# Patient Record
Sex: Female | Born: 2000 | Hispanic: Yes | Marital: Single | Smoking: Never smoker
Health system: Southern US, Community
[De-identification: ages and names within clinical notes are randomized; demographics above are authoritative.]

---

## 2019-12-11 ENCOUNTER — Encounter: Payer: Self-pay | Admitting: Emergency Medicine

## 2019-12-11 ENCOUNTER — Other Ambulatory Visit: Payer: Self-pay

## 2019-12-11 ENCOUNTER — Emergency Department
Admission: EM | Admit: 2019-12-11 | Discharge: 2019-12-11 | Disposition: A | Discharge status: Home / Self Care | Payer: No Typology Code available for payment source | Attending: Emergency Medicine | Admitting: Emergency Medicine

## 2019-12-11 ENCOUNTER — Emergency Department: Payer: No Typology Code available for payment source

## 2019-12-11 DIAGNOSIS — Y999 Unspecified external cause status: Secondary | ICD-10-CM | POA: Diagnosis not present

## 2019-12-11 DIAGNOSIS — Y939 Activity, unspecified: Secondary | ICD-10-CM | POA: Insufficient documentation

## 2019-12-11 DIAGNOSIS — S59912A Unspecified injury of left forearm, initial encounter: Secondary | ICD-10-CM | POA: Diagnosis present

## 2019-12-11 DIAGNOSIS — S40022A Contusion of left upper arm, initial encounter: Secondary | ICD-10-CM | POA: Diagnosis not present

## 2019-12-11 DIAGNOSIS — Y929 Unspecified place or not applicable: Secondary | ICD-10-CM | POA: Insufficient documentation

## 2019-12-11 MED ORDER — IBUPROFEN 600 MG PO TABS
600.0000 mg | ORAL_TABLET | Freq: Once | ORAL | Status: AC
  Administered 2019-12-11: 600 mg via ORAL
  Filled 2019-12-11: qty 1
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
## 2019-12-11 NOTE — Discharge Instructions (Addendum)
You may take Tylenol and/or Ibuprofen as needed for pain.  Wear sling as needed for comfort.  Return to the ER for worsening symptoms, persistent vomiting, difficulty breathing or other concerns.

## 2020-12-01 ENCOUNTER — Encounter (HOSPITAL_BASED_OUTPATIENT_CLINIC_OR_DEPARTMENT_OTHER): Payer: Self-pay

## 2020-12-01 ENCOUNTER — Other Ambulatory Visit: Payer: Self-pay

## 2020-12-01 ENCOUNTER — Emergency Department (HOSPITAL_BASED_OUTPATIENT_CLINIC_OR_DEPARTMENT_OTHER)
Admission: EM | Admit: 2020-12-01 | Discharge: 2020-12-02 | Disposition: A | Discharge status: Home / Self Care | Payer: Medicaid Other | Attending: Emergency Medicine | Admitting: Emergency Medicine

## 2020-12-01 ENCOUNTER — Emergency Department (HOSPITAL_BASED_OUTPATIENT_CLINIC_OR_DEPARTMENT_OTHER): Payer: Medicaid Other

## 2020-12-01 DIAGNOSIS — M25511 Pain in right shoulder: Secondary | ICD-10-CM | POA: Diagnosis not present

## 2020-12-01 DIAGNOSIS — S99911A Unspecified injury of right ankle, initial encounter: Secondary | ICD-10-CM | POA: Diagnosis not present

## 2020-12-01 DIAGNOSIS — W19XXXA Unspecified fall, initial encounter: Secondary | ICD-10-CM

## 2020-12-01 DIAGNOSIS — M546 Pain in thoracic spine: Secondary | ICD-10-CM | POA: Insufficient documentation

## 2020-12-01 DIAGNOSIS — W108XXA Fall (on) (from) other stairs and steps, initial encounter: Secondary | ICD-10-CM | POA: Insufficient documentation

## 2020-12-01 MED ORDER — METHOCARBAMOL 500 MG PO TABS: 500.0000 mg | ORAL_TABLET | Freq: Two times a day (BID) | ORAL | 0 refills | Status: AC

## 2020-12-01 MED ORDER — HYDROCODONE-ACETAMINOPHEN 5-325 MG PO TABS: 1.0000 | ORAL_TABLET | Freq: Four times a day (QID) | ORAL | 0 refills | Status: AC | PRN

## 2020-12-01 MED ORDER — LIDOCAINE 5 % EX PTCH: 1.0000 | MEDICATED_PATCH | CUTANEOUS | 0 refills | Status: AC

## 2020-12-01 MED ORDER — IBUPROFEN 600 MG PO TABS: 600.0000 mg | ORAL_TABLET | Freq: Four times a day (QID) | ORAL | 0 refills | Status: AC | PRN

## 2020-12-01 NOTE — ED Triage Notes (Addendum)
Pt states she slipped/fell down steps today-pain to right shoulder and right ankle-to triage in w/c-NAD

## 2020-12-01 NOTE — ED Provider Notes (Signed)
[facility] EMERGENCY DEPARTMENT Provider Note   CSN: [id number] Arrival date & time: 12/01/20  2204     History Chief Complaint  Patient presents with  . Fall    Maria Nichols is a 19 y.o. female.  HPI     Maria Nichols is a 19 y.o. female, patient with no pertinent past medical history, presenting to the ED with injuries following a fall that occurred shortly prior to arrival. Patient states she slipped and fell down around 10 steps. She is complaining of pain to the right posterior shoulder as well as the right ankle.  Denies LOC, nausea/vomiting, neck pain, other back pain, numbness, weakness, chest pain, abdominal pain, confusion, other joint pain, shortness of breath, or any other complaints.  History reviewed. No pertinent past medical history.  There are no problems to display for this patient.   History reviewed. No pertinent surgical history.   OB History   No obstetric history on file.     No family history on file.  Social History   Tobacco Use  . Smoking status: Never Smoker  . Smokeless tobacco: Never Used  Vaping Use  . Vaping Use: Never used  Substance Use Topics  . Alcohol use: Yes    Comment: occ  . Drug use: Never    Home Medications Prior to Admission medications   Medication Sig Start Date End Date Taking? Authorizing Provider  HYDROcodone-acetaminophen (NORCO/VICODIN) 5-325 MG tablet Take 1 tablet by mouth every 6 (six) hours as needed for severe pain. 12/01/20   Shawonda Kerce C, PA-C  ibuprofen (ADVIL) 600 MG tablet Take 1 tablet (600 mg total) by mouth every 6 (six) hours as needed. 12/01/20   Rollie Hynek C, PA-C  lidocaine (LIDODERM) 5 % Place 1 patch onto the skin daily. Remove & Discard patch within 12 hours or as directed by MD 12/01/20   Tearsa Kowalewski C, PA-C  methocarbamol (ROBAXIN) 500 MG tablet Take 1 tablet (500 mg total) by mouth 2 (two) times daily. 12/01/20   Deysha Cartier, Hillard Danker, PA-C    Allergies    Patient  has no known allergies.  Review of Systems   Review of Systems  Constitutional: Negative for diaphoresis.  Respiratory: Negative for shortness of breath.   Cardiovascular: Negative for chest pain.  Gastrointestinal: Negative for abdominal pain, nausea and vomiting.  Musculoskeletal: Positive for arthralgias and back pain. Negative for neck pain.  Neurological: Negative for dizziness, syncope, weakness, numbness and headaches.  All other systems reviewed and are negative.   Physical Exam Updated Vital Signs BP 119/85 (BP Location: Left Arm)   Pulse 93   Temp 97.8 F (36.6 C) (Oral)   Resp 18   Ht 5\' 2"  (1.575 m)   Wt 61.2 kg   SpO2 100%   BMI 24.69 kg/m   Physical Exam Vitals and nursing note reviewed.  Constitutional:      General: She is not in acute distress.    Appearance: She is well-developed. She is not diaphoretic.  HENT:     Head: Normocephalic.     Comments: No evidence of wounds or trauma to the head or face.    Mouth/Throat:     Mouth: Mucous membranes are moist.     Pharynx: Oropharynx is clear.  Eyes:     Conjunctiva/sclera: Conjunctivae normal.  Cardiovascular:     Rate and Rhythm: Normal rate and regular rhythm.     Pulses: Normal pulses.  Radial pulses are 2+ on the right side and 2+ on the left side.       Dorsalis pedis pulses are 2+ on the right side and 2+ on the left side.       Posterior tibial pulses are 2+ on the right side and 2+ on the left side.     Heart sounds: Normal heart sounds.  Pulmonary:     Effort: Pulmonary effort is normal. No respiratory distress.     Breath sounds: Normal breath sounds.  Abdominal:     Palpations: Abdomen is soft.     Tenderness: There is no abdominal tenderness. There is no guarding.  Musculoskeletal:       Arms:     Cervical back: Normal range of motion and neck supple.     Comments: Tenderness to the right anterior and lateral ankle with no noted deformity or instability.  No tenderness, pain,  or other signs of injury into the rest of the right lower extremity or into the foot.  Tenderness to the right upper back over the right scapula with very tiny areas of erythema noted but no swelling, deformity, or instability.  Range of motion in the right shoulder largely intact, including ability to raise her arm over her head.  No winging of the scapula noted. No tenderness or pain over the clavicles. She has tenderness to the right posterior ribs without crepitus or noted deformity.  Normal motor function intact in all extremities. No midline spinal tenderness.   Skin:    General: Skin is warm and dry.  Neurological:     Mental Status: She is alert.     Comments: No noted acute cognitive deficit. Sensation grossly intact to light touch in the extremities.   Grip strengths equal bilaterally.   Strength 5/5 in all extremities.  No gait disturbance.  Coordination intact.  Cranial nerves III-XII grossly intact.  Handles oral secretions without noted difficulty.  No noted phonation or speech deficit. No facial droop.   Psychiatric:        Mood and Affect: Mood and affect normal.        Speech: Speech normal.        Behavior: Behavior normal.     ED Results / Procedures / Treatments   Labs (all labs ordered are listed, but only abnormal results are displayed) Labs Reviewed - No data to display  EKG None  Radiology DG Shoulder Right  Result Date: 12/01/2020 CLINICAL DATA:  Slip and fall down stairs today with right shoulder injury. EXAM: RIGHT SHOULDER - 2+ VIEW COMPARISON:  None. FINDINGS: There is no evidence of fracture or dislocation. There is no evidence of arthropathy or other focal bone abnormality. Soft tissues are unremarkable. Included right hemithorax is intact. IMPRESSION: Negative radiographs of the right shoulder. Electronically Signed   By: Narda Rutherford M.D.   On: 12/01/2020 22:45   DG Ankle Complete Right  Result Date: 12/01/2020 CLINICAL DATA:  Injury  EXAM: RIGHT ANKLE - COMPLETE 3+ VIEW COMPARISON:  None. FINDINGS: There is no evidence of fracture, dislocation, or joint effusion. There is no evidence of arthropathy or other focal bone abnormality. Soft tissues are unremarkable. IMPRESSION: Negative. Electronically Signed   By: Jasmine Pang M.D.   On: 12/01/2020 22:44    Procedures Procedures (including critical care time)  Medications Ordered in ED Medications - No data to display  ED Course  I have reviewed the triage vital signs and the nursing notes.  Pertinent labs &  imaging results that were available during my care of the patient were reviewed by me and considered in my medical decision making (see chart for details).    MDM Rules/Calculators/A&P                          Patient presents for evaluation following a fall. No focal neurologic deficits noted. I personally reviewed and interpreted the patient's x-rays.  No acute osseous abnormalities noted. Based on the patient's areas of tenderness found on my exam, I recommended additional x-rays, including scapula and ribs, with explanation given to the patient for my reasoning, however, patient declined. The patient was given instructions for home care as well as return precautions. Patient voices understanding of these instructions, accepts the plan, and is comfortable with discharge.     Final Clinical Impression(s) / ED Diagnoses Final diagnoses:  Fall, initial encounter  Acute pain of right shoulder  Injury of right ankle, initial encounter    Rx / DC Orders ED Discharge Orders         Ordered    ibuprofen (ADVIL) 600 MG tablet  Every 6 hours PRN        12/01/20 2336    methocarbamol (ROBAXIN) 500 MG tablet  2 times daily        12/01/20 2336    lidocaine (LIDODERM) 5 %  Every 24 hours        12/01/20 2336    HYDROcodone-acetaminophen (NORCO/VICODIN) 5-325 MG tablet  Every 6 hours PRN        12/01/20 2336           Anselm Pancoast, PA-C 12/02/20 0025     Molpus, John, MD 12/02/20 [phone]

## 2020-12-01 NOTE — Discharge Instructions (Addendum)
You have been seen today for injuries to the ankle and shoulder. There were no acute abnormalities on the x-rays, including no sign of fracture or dislocation, however, there could be injuries to the soft tissues, such as the ligaments or tendons that are not seen on xrays. There could also be what are called occult fractures that are small fractures not seen on xray. Antiinflammatory medications: Take 600 mg of ibuprofen every 6 hours or 440 mg (over the counter dose) to 500 mg (prescription dose) of naproxen every 12 hours for the next 3 days. After this time, these medications may be used as needed for pain. Take these medications with food to avoid upset stomach. Choose only one of these medications, do not take them together. Acetaminophen (generic for Tylenol): Should you continue to have additional pain while taking the ibuprofen or naproxen, you may add in acetaminophen as needed. Your daily total maximum amount of acetaminophen from all sources should be limited to 4000mg /day for persons without liver problems, or 2000mg /day for those with liver problems. Vicodin: May take Vicodin (hydrocodone-acetaminophen) as needed for severe pain.   Do not drive or perform other dangerous activities while taking this medication as it can cause drowsiness as well as changes in reaction time and judgement.   Please note that each pill of Vicodin contains 325 mg of acetaminophen (generic for Tylenol) and the above dosage limits apply.  Methocarbamol: Methocarbamol (generic for Robaxin) is a muscle relaxer and can help relieve stiff muscles or muscle spasms.  Do not drive or perform other dangerous activities while taking this medication as it can cause drowsiness as well as changes in reaction time and judgement. Lidocaine patches: These are available via either prescription or over-the-counter. The over-the-counter option may be more economical one and are likely just as effective. There are multiple  over-the-counter brands, such as Salonpas. Ice: May apply ice to the area over the next 24 hours for 15 minutes at a time to reduce swelling. Elevation: Keep the extremity elevated as often as possible to reduce pain and inflammation. Support: Wear the cam boot for support and comfort. Wear this until pain resolves. You will be weight-bearing as tolerated, which means you can slowly start to put weight on the extremity and increase amount and frequency as pain allows. Follow up: If symptoms are improving, you may follow up with your primary care provider for any continued management. If symptoms are not starting to improve within a week, you should follow up with the orthopedic specialist within two weeks. Return: Return to the ED for numbness, weakness, increasing pain, overall worsening symptoms, loss of function, or if symptoms are not improving, you have tried to follow up with the orthopedic specialist, and have been unable to do so.  For prescription assistance, may try using prescription discount sites or apps, such as goodrx.com or Good Rx smart phone app.

## 2020-12-21 ENCOUNTER — Telehealth: Payer: Self-pay | Admitting: Family Medicine

## 2020-12-21 NOTE — Telephone Encounter (Signed)
Called pt to schedule ED follow up appt w/provider-- no answer, unable to lv msg, phone just rings.  --call back later.  --glh

## 2021-09-25 IMAGING — DX DG SHOULDER 2+V*R*
3 series · 3 of 3 positions shown · non-contrast
Comparison: None.

CLINICAL DATA: Slip and fall down stairs today with right shoulder
injury.

EXAM:
RIGHT SHOULDER - 2+ VIEW

[shoulder grashey]
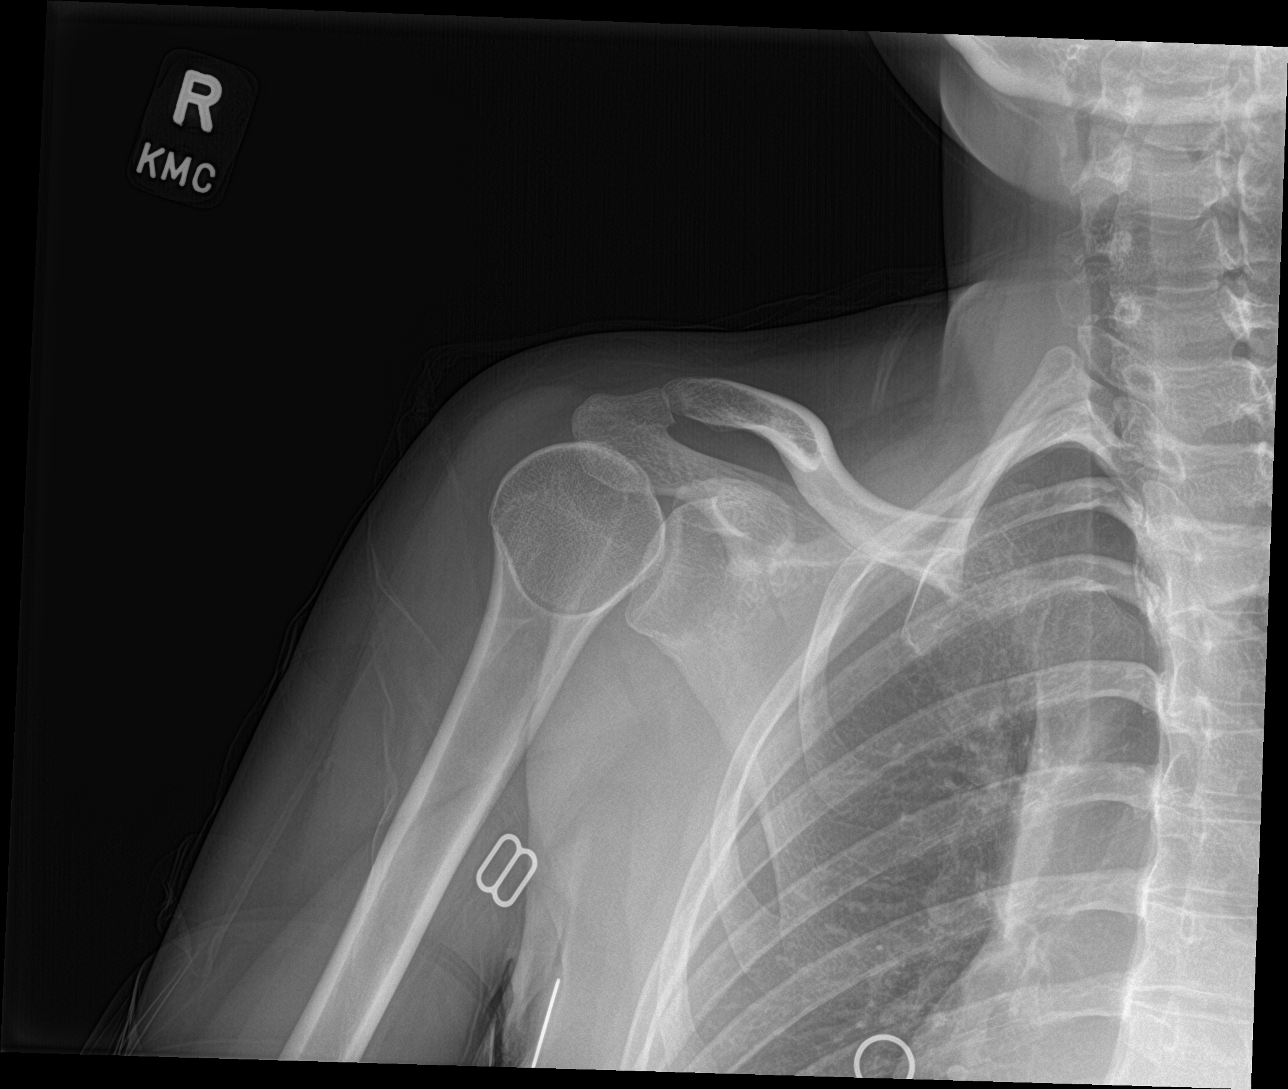

[shoulder y view]
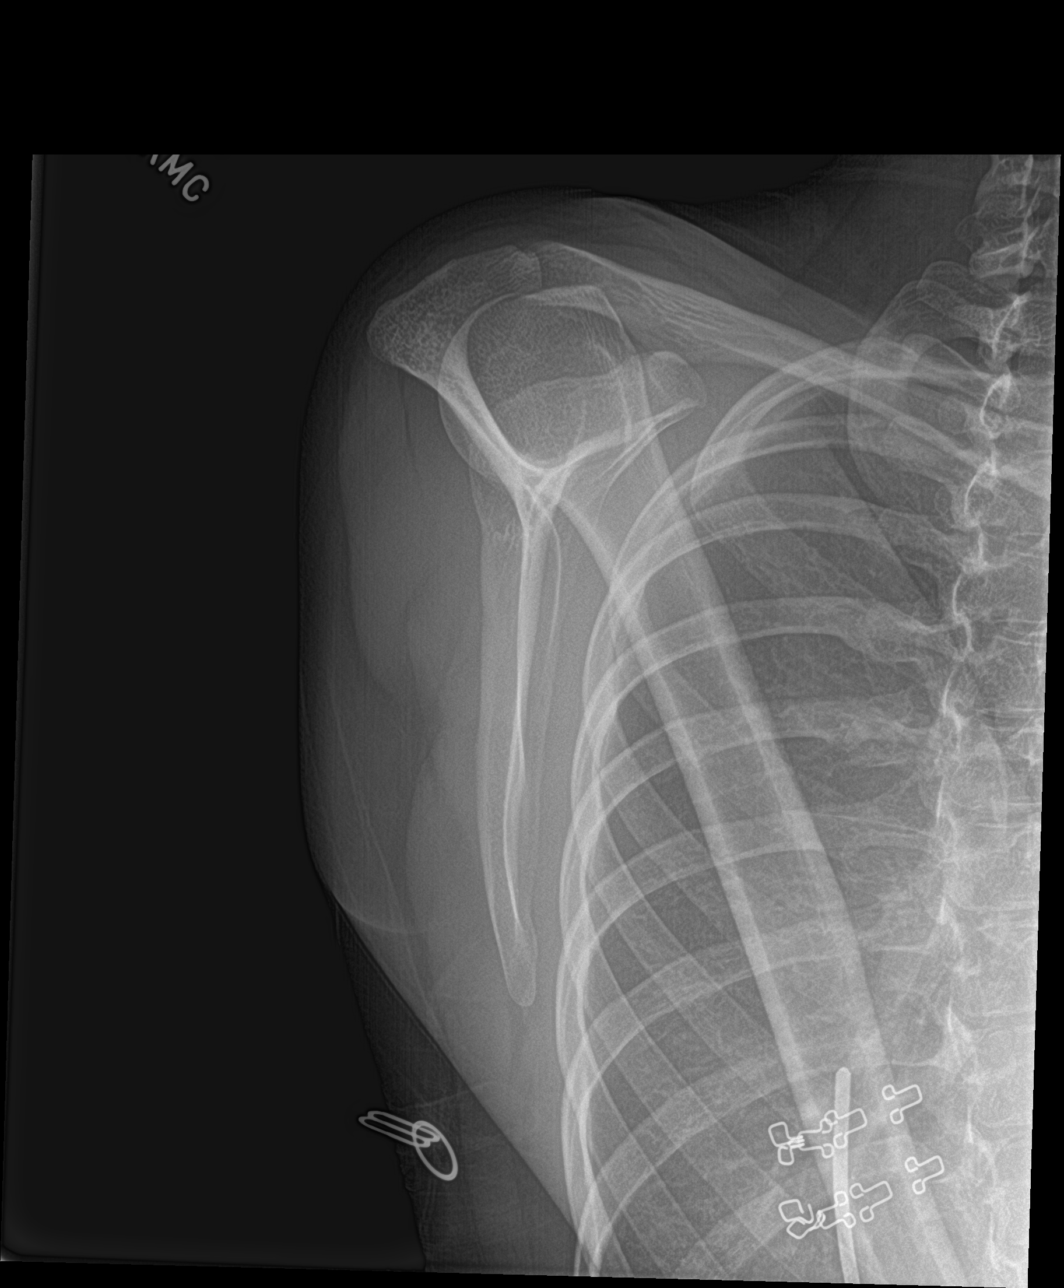

[shoulder axillary]
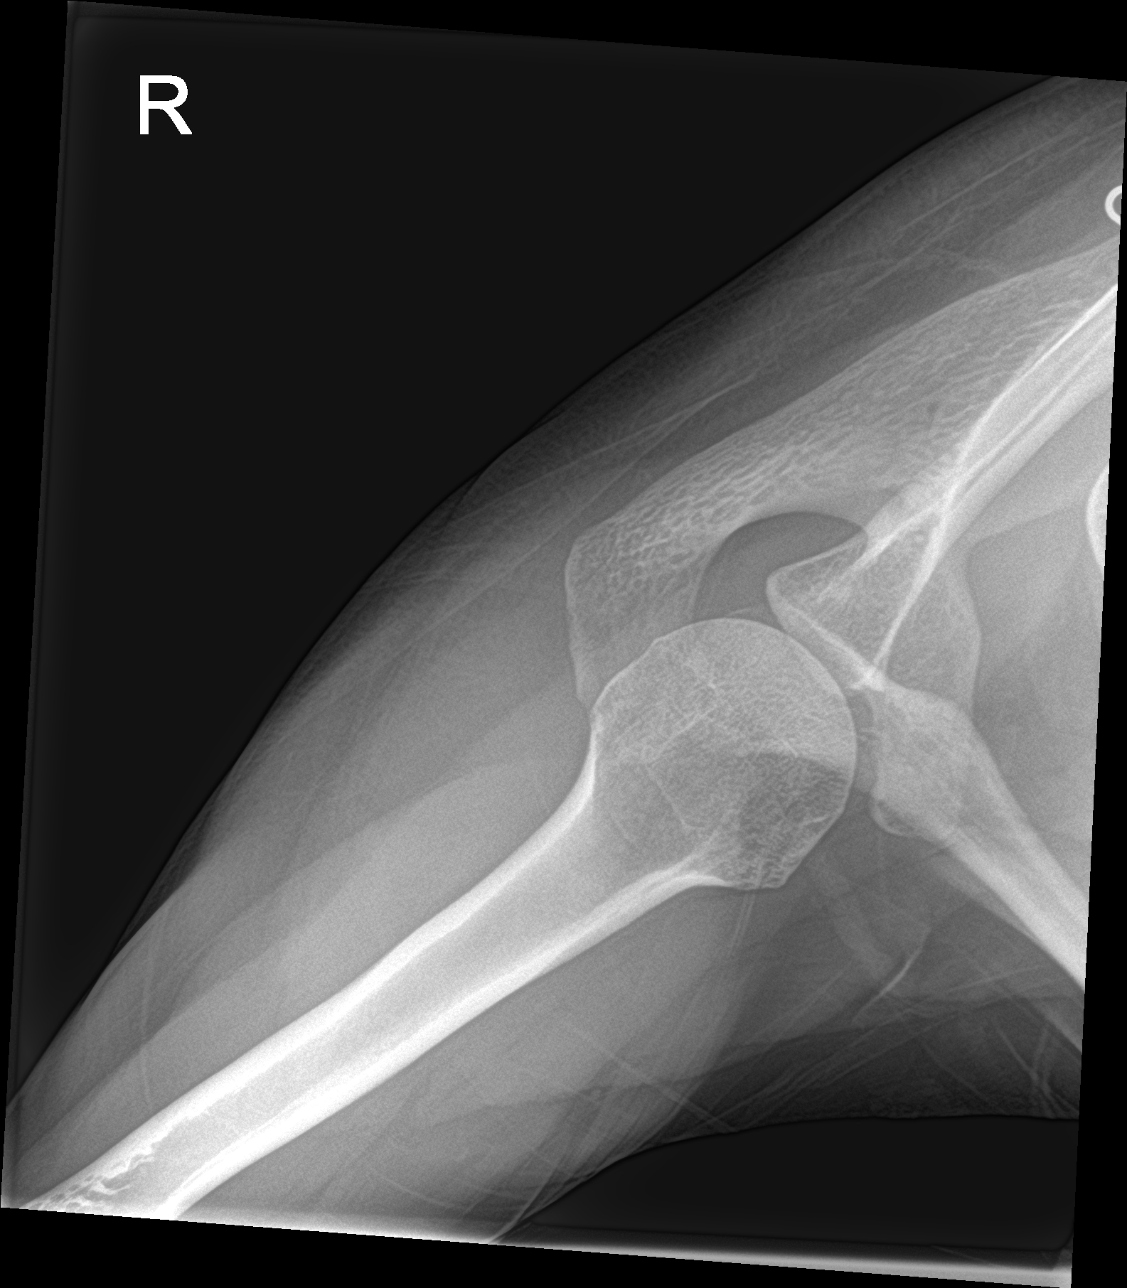

[3 of 3 positions shown; findings below may reference images not displayed]

FINDINGS: There is no evidence of fracture or dislocation. There is no
evidence of arthropathy or other focal bone abnormality. Soft
tissues are unremarkable. Included right hemithorax is intact.
IMPRESSION: Negative radiographs of the right shoulder.

## 2023-04-04 ENCOUNTER — Encounter: Payer: Self-pay | Admitting: *Deleted
